# Patient Record
Sex: Male | Born: 1985 | Race: White | Hispanic: No | Marital: Single | Smoking: Current every day smoker
Health system: Southern US, Community
[De-identification: ages and names within clinical notes are randomized; demographics above are authoritative.]

## PROBLEM LIST (undated history)

## (undated) DIAGNOSIS — F419 Anxiety disorder, unspecified: Secondary | ICD-10-CM

## (undated) DIAGNOSIS — F319 Bipolar disorder, unspecified: Secondary | ICD-10-CM

## (undated) DIAGNOSIS — F329 Major depressive disorder, single episode, unspecified: Secondary | ICD-10-CM

---
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
## 2007-09-15 HISTORY — PX: CHOLECYSTECTOMY: SHX55

## 2018-04-16 ENCOUNTER — Emergency Department (HOSPITAL_COMMUNITY)
Admission: EM | Admit: 2018-04-16 | Discharge: 2018-04-16 | Disposition: A | Discharge status: Home / Self Care | Payer: Self-pay | Attending: Emergency Medicine | Admitting: Emergency Medicine

## 2018-04-16 ENCOUNTER — Other Ambulatory Visit: Payer: Self-pay

## 2018-04-16 ENCOUNTER — Encounter (HOSPITAL_COMMUNITY): Payer: Self-pay | Admitting: Emergency Medicine

## 2018-04-16 DIAGNOSIS — Z046 Encounter for general psychiatric examination, requested by authority: Secondary | ICD-10-CM | POA: Insufficient documentation

## 2018-04-16 DIAGNOSIS — F329 Major depressive disorder, single episode, unspecified: Secondary | ICD-10-CM | POA: Insufficient documentation

## 2018-04-16 DIAGNOSIS — R45851 Suicidal ideations: Secondary | ICD-10-CM | POA: Insufficient documentation

## 2018-04-16 DIAGNOSIS — T50902A Poisoning by unspecified drugs, medicaments and biological substances, intentional self-harm, initial encounter: Secondary | ICD-10-CM | POA: Insufficient documentation

## 2018-04-16 DIAGNOSIS — F32A Depression, unspecified: Secondary | ICD-10-CM

## 2018-04-16 DIAGNOSIS — F419 Anxiety disorder, unspecified: Secondary | ICD-10-CM | POA: Insufficient documentation

## 2018-04-16 DIAGNOSIS — F17290 Nicotine dependence, other tobacco product, uncomplicated: Secondary | ICD-10-CM | POA: Insufficient documentation

## 2018-04-16 DIAGNOSIS — Y999 Unspecified external cause status: Secondary | ICD-10-CM | POA: Insufficient documentation

## 2018-04-16 DIAGNOSIS — X838XXA Intentional self-harm by other specified means, initial encounter: Secondary | ICD-10-CM | POA: Insufficient documentation

## 2018-04-16 DIAGNOSIS — T1491XA Suicide attempt, initial encounter: Secondary | ICD-10-CM | POA: Insufficient documentation

## 2018-04-16 DIAGNOSIS — Y9389 Activity, other specified: Secondary | ICD-10-CM | POA: Insufficient documentation

## 2018-04-16 DIAGNOSIS — Y929 Unspecified place or not applicable: Secondary | ICD-10-CM | POA: Insufficient documentation

## 2018-04-16 HISTORY — DX: Bipolar disorder, unspecified: F31.9

## 2018-04-16 HISTORY — DX: Anxiety disorder, unspecified: F41.9

## 2018-04-16 HISTORY — DX: Major depressive disorder, single episode, unspecified: F32.9

## 2018-04-16 LAB — SALICYLATE LEVEL: Salicylate Lvl: <7 mg/dL (ref 2.8–30.0)

## 2018-04-16 LAB — CBC
HCT: 48.4 % (ref 39.0–52.0)
HEMOGLOBIN: 15.3 g/dL (ref 13.0–17.0)
MCH: 24.9 pg — ABNORMAL LOW (ref 26.0–34.0)
MCHC: 31.6 g/dL (ref 30.0–36.0)
MCV: 78.7 fL (ref 78.0–100.0)
Platelets: 305 10*3/uL (ref 150–400)
RBC: 6.15 MIL/uL — ABNORMAL HIGH (ref 4.22–5.81)
RDW: 15.8 % — AB (ref 11.5–15.5)
WBC: 13.7 10*3/uL — ABNORMAL HIGH (ref 4.0–10.5)

## 2018-04-16 LAB — COMPREHENSIVE METABOLIC PANEL
ALBUMIN: 3.6 g/dL (ref 3.5–5.0)
ALT: 34 U/L (ref 0–44)
AST: 25 U/L (ref 15–41)
Alkaline Phosphatase: 74 U/L (ref 38–126)
Anion gap: 15 (ref 5–15)
BUN: 19 mg/dL (ref 6–20)
CHLORIDE: 102 mmol/L (ref 98–111)
CO2: 23 mmol/L (ref 22–32)
CREATININE: 1.33 mg/dL — AB (ref 0.61–1.24)
Calcium: 9.5 mg/dL (ref 8.9–10.3)
GFR calc Af Amer: >60 mL/min (ref >60)
GFR calc non Af Amer: >60 mL/min (ref >60)
Glucose, Bld: 97 mg/dL (ref 70–99)
Potassium: 4 mmol/L (ref 3.5–5.1)
SODIUM: 140 mmol/L (ref 135–145)
Total Bilirubin: 0.4 mg/dL (ref 0.3–1.2)
Total Protein: 7.1 g/dL (ref 6.5–8.1)

## 2018-04-16 LAB — ACETAMINOPHEN LEVEL: Acetaminophen (Tylenol), Serum: <10 ug/mL — ABNORMAL LOW (ref 10–30)

## 2018-04-16 LAB — ETHANOL: Alcohol, Ethyl (B): <10 mg/dL (ref <10)

## 2018-04-16 LAB — RAPID URINE DRUG SCREEN, HOSP PERFORMED
Amphetamines: NOT DETECTED
Barbiturates: NOT DETECTED
Benzodiazepines: NOT DETECTED
COCAINE: NOT DETECTED
OPIATES: NOT DETECTED
Tetrahydrocannabinol: POSITIVE — AB

## 2018-04-16 NOTE — ED Provider Notes (Signed)
[facility] Provider Note   CSN: [id number] Arrival date & time: 04/16/18  0030     History   Chief Complaint Chief Complaint  Patient presents with  . Drug Overdose  . Suicide Attempt    HPI Bradley Le is a 32 y.o. male.  Patient is a 32 year old male with past medical history of bipolar disorder, anxiety, and morbid obesity.  He presents today for evaluation of an overdose.  He states earlier this evening he had what he describes as a "severe anxiety attack".  He states he then took 5-25 mg Vistaril tablets along with 8-10 mg buspirone tablets.  He tells me he did this with the intent of harming himself.  He denies any drug or alcohol use.  He denies any other substance ingestion.  The history is provided by the patient.  Drug Overdose  This is a new problem. The current episode started 1 to 2 hours ago. The problem occurs constantly. The problem has not changed since onset.Pertinent negatives include no chest pain and no abdominal pain. Nothing aggravates the symptoms. Nothing relieves the symptoms. He has tried nothing for the symptoms.    Past Medical History:  Diagnosis Date  . Anxiety disorder 2004  . Bipolar 1 disorder (HCC) 2004  . Depression 2004    There are no active problems to display for this patient.         Home Medications    Prior to Admission medications   Not on File    Family History No family history on file.  Social History Social History   Tobacco Use  . Smoking status: Current Every Day Smoker    Packs/day: 0.50    Years: 13.00    Pack years: 6.50    Types: Pipe  . Smokeless tobacco: Never Used  Substance Use Topics  . Alcohol use: Yes    Comment: occasional drink  . Drug use: Never     Allergies   Sulfa antibiotics   Review of Systems Review of Systems  Cardiovascular: Negative for chest pain.  Gastrointestinal: Negative for abdominal pain.  All other systems reviewed and  are negative.    Physical Exam Updated Vital Signs BP (!) 154/107 (BP Location: Right Arm)   Pulse (!) 106   Temp 98 F (36.7 C) (Oral)   Resp 18   Ht 5\' 11"  (1.803 m)   Wt (!) 188.7 kg (416 lb)   SpO2 95%   BMI 58.02 kg/m   Physical Exam  Constitutional: He is oriented to person, place, and time. He appears well-developed and well-nourished. No distress.  HENT:  Head: Normocephalic and atraumatic.  Mouth/Throat: Oropharynx is clear and moist.  Eyes: Pupils are equal, round, and reactive to light.  Neck: Normal range of motion. Neck supple.  Cardiovascular: Normal rate and regular rhythm. Exam reveals no friction rub.  No murmur heard. Pulmonary/Chest: Effort normal and breath sounds normal. No respiratory distress. He has no wheezes. He has no rales.  Abdominal: Soft. Bowel sounds are normal. He exhibits no distension. There is no tenderness.  Musculoskeletal: Normal range of motion. He exhibits no edema.  Neurological: He is alert and oriented to person, place, and time. Coordination normal.  Skin: Skin is warm and dry. He is not diaphoretic.  Nursing note and vitals reviewed.    ED Treatments / Results  Labs (all labs ordered are listed, but only abnormal results are displayed) Labs Reviewed  COMPREHENSIVE METABOLIC PANEL  ETHANOL  SALICYLATE LEVEL  ACETAMINOPHEN LEVEL  CBC  RAPID URINE DRUG SCREEN, HOSP PERFORMED    ED ECG REPORT   Date: 04/16/2018  Rate: 110  Rhythm: sinus tachycardia  QRS Axis: normal  Intervals: normal  ST/T Wave abnormalities: normal  Conduction Disutrbances:none  Narrative Interpretation:   Old EKG Reviewed: none available  I have personally reviewed the EKG tracing and agree with the computerized printout as noted.   Radiology No results found.  Procedures Procedures (including critical care time)  Medications Ordered in ED Medications - No data to display   Initial Impression / Assessment and Plan / ED Course  I have  reviewed the triage vital signs and the nursing notes.  Pertinent labs & imaging results that were available during my care of the patient were reviewed by me and considered in my medical decision making (see chart for details).  Patient seen by TTS following an overdose of Benadryl and Seroquel.  He has been observed for multiple hours in the ER and appears medically cleared.  TTS is recommending inpatient.  Final Clinical Impressions(s) / ED Diagnoses   Final diagnoses:  None    ED Discharge Orders    None       Geoffery Lyons, MD 04/16/18 [id number]

## 2018-04-16 NOTE — ED Notes (Signed)
ALL belongings - 1 labeled belongings bag - NO Valuables envelope noted - returned to pt - Pt signed verifying all items present. Pt called someone for ride from ED.

## 2018-04-16 NOTE — ED Notes (Signed)
Staffing office called for sitter. 

## 2018-04-16 NOTE — [facility] Suicide Risk Assessment (Cosign Needed)
Suicide Risk Assessment  Discharge Assessment   [facility]   Principal Problem: Depression Discharge Diagnoses: There are no active problems to display for this patient.   Total Time spent with patient: 30 minutes  Musculoskeletal: Strength & Muscle Tone: within normal limits Gait & Station: not tested Patient leans: N/A Pt was seen and chart reviewed with treatment team and Dr Jannifer FranklinAkintayo. Pt stated he was depressed and so he came to the hospital, he was feeling suicidal but today stated that is no longer the case and he wants to be discharged home. He lives with his partners grandmother and is able to contract for safety upon discharge. According to chart review he stated on admission that he took 5 25 mg vistaril and 8 Buspar 10 mg because he was having a bad day and wanted to hurt himself. Pt stated he wasn't wanting to die, just numb the pain. Pt denies drug and alcohol use, BAL negative, UDS positive for THC. Lab work is WNL, no other diagnostics ordered during this admission. [facility] and has an appointment on Monday 04-18-2018. Pt is stable and psychiatrically clear for discharge.   Psychiatric Specialty Exam:   Blood pressure (!) 148/87, pulse 89, temperature 98.4 F (36.9 C), temperature source Oral, resp. rate 18, height 5\' 11"  (1.803 m), weight (!) 416 lb (188.7 kg), SpO2 95 %.Body mass index is 58.02 kg/m.  General Appearance: Casual  Eye Contact::  Good  Speech:  Clear and Coherent and Normal Rate409  Volume:  Normal  Mood:  Depressed  Affect:  Congruent, Depressed and Flat  Thought Process:  Coherent, Goal Directed, Linear and Descriptions of Associations: Intact  Orientation:  Full (Time, Place, and Person)  Thought Content:  Logical  Suicidal Thoughts:  No  Homicidal Thoughts:  No  Memory:  Immediate;   Good Recent;   Good Remote;   Fair  Judgement:  Fair  Insight:  Fair  Psychomotor  Activity:  Normal  Concentration:  Good  Recall:  Good  Fund of Knowledge:Good  Language: Good  Akathisia:  No  Handed:  Right  AIMS (if indicated):     Assets:  Communication Skills Desire for Improvement Financial Resources/Insurance Housing Social Support  Sleep:     Cognition: WNL  ADL's:  Intact   Mental Status Per Nursing Assessment::   On Admission:   Depressed   Demographic Factors:  Male, Caucasian, Low socioeconomic status and Unemployed  Loss Factors: Financial problems/change in socioeconomic status  Historical Factors: Impulsivity  Risk Reduction Factors:   Sense of responsibility to family, Living with another person, especially a relative and Positive social support  Continued Clinical Symptoms:  Depression:   Impulsivity  Cognitive Features That Contribute To Risk:  Closed-mindedness    Suicide Risk:  Minimal: No identifiable suicidal ideation.  Patients presenting with no risk factors but with morbid ruminations; may be classified as minimal risk based on the severity of the depressive symptoms    Plan Of Care/Follow-up recommendations:  Activity:  as tolerated Diet:  Heart healthy  Laveda AbbeLaurie Britton Laritza Vokes, NP 04/16/2018, 10:18 AM
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
## 2018-04-16 NOTE — ED Triage Notes (Signed)
  Patient BIB [facility] after intentional OD of vistaril (5 25mg ) and busparone (8 10mg ).  Patient was trying to hurt himself because he was having a "bad day".  Patient was immediately remorseful and called EMS.  Patient states this was his first time ever trying to hurt himself.  Patient is A&O x 4.  VSS.

## 2018-04-16 NOTE — Progress Notes (Signed)
TTS consulted with Nanine MeansJamison Lord, DNP who recommends inpt treatment. EDP Delo, Riley Lamouglas, MD has been advised.  TTS attempted to inform RN staff of the disposition but no answer when called.  Princess BruinsAquicha Ivann Trimarco, MSW, LCSW Therapeutic Triage Specialist  [phone]

## 2018-04-16 NOTE — ED Notes (Signed)
Pt arrived to [facility]8 - ambulatory - noted to be wearing eyeglasses and wearing burgundy scrubs - large size gown given for comfort. Pt voiced understanding of Medical Clearance Pt Policy. Sitter w/pt. Pt states he ate breakfast.

## 2018-04-16 NOTE — Progress Notes (Signed)
Patient meets criteria for inpatient treatment. CSW faxed referrals to the following facilities for review.  [facility], [facility], [facility], [facility], [address] [place], [facility], [facility], [facility], [address], [facility], [address], [facility], [facility], [facility], [facility], [facility], and [facility].   TTS will continue to seek bed placement.     Moss McKy-sha Annasofia Pohl, MSW, LCSW, LCAS 04/16/2018 8:05 AM

## 2018-04-16 NOTE — ED Notes (Signed)
Reg bfast tray ordered/No [place]

## 2018-04-16 NOTE — ED Notes (Signed)
  Patient placed in purple scrubs and belongings taken out of room.  Patient has been wanded by security.

## 2019-09-01 ENCOUNTER — Emergency Department (HOSPITAL_COMMUNITY): Payer: Medicaid Other

## 2019-09-01 ENCOUNTER — Encounter (HOSPITAL_COMMUNITY): Payer: Self-pay | Admitting: Emergency Medicine

## 2019-09-01 ENCOUNTER — Emergency Department (HOSPITAL_COMMUNITY)
Admission: EM | Admit: 2019-09-01 | Discharge: 2019-09-01 | Disposition: A | Discharge status: Home / Self Care | Payer: Medicaid Other | Attending: Emergency Medicine | Admitting: Emergency Medicine

## 2019-09-01 ENCOUNTER — Other Ambulatory Visit: Payer: Self-pay

## 2019-09-01 DIAGNOSIS — M25551 Pain in right hip: Secondary | ICD-10-CM | POA: Insufficient documentation

## 2019-09-01 DIAGNOSIS — R202 Paresthesia of skin: Secondary | ICD-10-CM | POA: Diagnosis not present

## 2019-09-01 DIAGNOSIS — M25512 Pain in left shoulder: Secondary | ICD-10-CM | POA: Diagnosis present

## 2019-09-01 DIAGNOSIS — F17210 Nicotine dependence, cigarettes, uncomplicated: Secondary | ICD-10-CM | POA: Diagnosis not present

## 2019-09-01 DIAGNOSIS — Z79899 Other long term (current) drug therapy: Secondary | ICD-10-CM | POA: Insufficient documentation

## 2019-09-01 DIAGNOSIS — T07XXXA Unspecified multiple injuries, initial encounter: Secondary | ICD-10-CM

## 2019-09-01 DIAGNOSIS — W19XXXA Unspecified fall, initial encounter: Secondary | ICD-10-CM

## 2019-09-01 MED ORDER — IBUPROFEN 600 MG PO TABS: 600.0000 mg | ORAL_TABLET | Freq: Four times a day (QID) | ORAL | 0 refills | Status: AC | PRN

## 2019-09-01 MED ORDER — IBUPROFEN 800 MG PO TABS
800.0000 mg | ORAL_TABLET | Freq: Once | ORAL | Status: AC
  Administered 2019-09-01: 800 mg via ORAL
  Filled 2019-09-01: qty 1

## 2019-09-01 MED ORDER — HYDROCODONE-ACETAMINOPHEN 5-325 MG PO TABS
1.0000 | ORAL_TABLET | Freq: Once | ORAL | Status: AC
  Administered 2019-09-01: 1 via ORAL
  Filled 2019-09-01: qty 1

## 2019-09-01 NOTE — Discharge Instructions (Signed)
Your x-rays are negative.  Take the anti-inflammatories as prescribed and follow-up with your doctor.  Return to the ED with new or worsening symptoms.

## 2019-09-01 NOTE — ED Notes (Signed)
ED Provider at bedside. 

## 2019-09-01 NOTE — ED Notes (Signed)
Patient transported to X-ray 

## 2019-09-01 NOTE — ED Provider Notes (Signed)
[facility]-EMERGENCY DEPT Provider Note   CSN: [id number] Arrival date & time: 09/01/19  2021     History Chief Complaint  Patient presents with  . Fall    Bradley DialDavid Salce is a 33 y.o. male.  Patient with history of morbid obesity, bipolar disorder presenting with left shoulder pain and right hip pain after a fall.  States he slipped in the mud and fell directly onto his low back.  Not hit his head or lose consciousness.  Since the fall has been having left shoulder pain and right hip pain.  Did not take anything for it.  He was able to ambulate for EMS.  Denies any neck pain.  Denies any abdominal pain, chest pain or shortness of breath.  Denies taking any blood thinners.  Had some tingling in his left hand initially which is since resolved.  Complains of pain in the right posterior hip and left anterior shoulder and also soreness in his low back.  The history is provided by the patient.  Fall Pertinent negatives include no chest pain, no abdominal pain, no headaches and no shortness of breath.       Past Medical History:  Diagnosis Date  . Anxiety disorder 2004  . Bipolar 1 disorder (HCC) 2004  . Depression 2004    Patient Active Problem List   Diagnosis Date Noted  . Depression 04/16/2018    Past Surgical History:  Procedure Laterality Date  . CHOLECYSTECTOMY  2009       No family history on file.  Social History   Tobacco Use  . Smoking status: Current Every Day Smoker    Packs/day: 0.50    Years: 13.00    Pack years: 6.50    Types: Pipe  . Smokeless tobacco: Never Used  Substance Use Topics  . Alcohol use: Yes    Comment: occasional drink  . Drug use: Never    Home Medications Prior to Admission medications   Medication Sig Start Date End Date Taking? Authorizing Provider  acetaminophen (TYLENOL) 500 MG tablet Take 1,000 mg by mouth every 6 (six) hours as needed (shoulder pain).    [provider]  ARIPiprazole  (ABILIFY PO) Take 1 tablet by mouth at bedtime.    [provider]  BUSPIRONE HCL PO Take 1.5 tablets by mouth 3 (three) times daily.    [provider]    Allergies    Sulfa antibiotics  Review of Systems   Review of Systems  Constitutional: Negative for activity change, appetite change and fever.  HENT: Negative for congestion and rhinorrhea.   Eyes: Negative for visual disturbance.  Respiratory: Negative for cough and shortness of breath.   Cardiovascular: Negative for chest pain.  Gastrointestinal: Negative for abdominal pain, nausea and vomiting.  Genitourinary: Negative for dysuria and hematuria.  Musculoskeletal: Positive for arthralgias, back pain and myalgias.  Skin: Negative for rash.  Neurological: Negative for dizziness and headaches.   all other systems are negative except as noted in the HPI and PMH.   Physical Exam Updated Vital Signs BP (!) 133/97   Pulse 93   Temp 98.2 F (36.8 C)   Resp (!) 21   SpO2 98%   Physical Exam Vitals and nursing note reviewed.  Constitutional:      General: He is not in acute distress.    Appearance: He is well-developed. He is obese.  HENT:     Head: Normocephalic and atraumatic.     Mouth/Throat:  Pharynx: No oropharyngeal exudate.  Eyes:     Conjunctiva/sclera: Conjunctivae normal.     Pupils: Pupils are equal, round, and reactive to light.  Neck:     Comments: No midline C-spine tenderness Cardiovascular:     Rate and Rhythm: Normal rate and regular rhythm.     Heart sounds: Normal heart sounds. No murmur.  Pulmonary:     Effort: Pulmonary effort is normal. No respiratory distress.     Breath sounds: Normal breath sounds.  Abdominal:     Palpations: Abdomen is soft.     Tenderness: There is no abdominal tenderness. There is no guarding or rebound.  Musculoskeletal:        General: Tenderness present. Normal range of motion.     Cervical back: Normal range of motion and neck supple.      Comments: Exam limited by body habitus.  There is paraspinal lumbar tenderness without step-off.  There is right posterior hip tenderness without shortening or external rotation.  Patient is able to stand and bear weight.  Left anterior shoulder tenderness without deformity.  Intact radial pulse.  Intact DP and PT pulses bilaterally. Pain with ROM R hip.  Skin:    General: Skin is warm.     Capillary Refill: Capillary refill takes less than 2 seconds.  Neurological:     General: No focal deficit present.     Mental Status: He is alert and oriented to person, place, and time. Mental status is at baseline.     Cranial Nerves: No cranial nerve deficit.     Motor: No abnormal muscle tone.     Coordination: Coordination normal.     Comments: No ataxia on finger to nose bilaterally. No pronator drift. 5/5 strength throughout. CN 2-12 intact.Equal grip strength. Sensation intact.   Psychiatric:        Behavior: Behavior normal.     ED Results / Procedures / Treatments   Labs (all labs ordered are listed, but only abnormal results are displayed) Labs Reviewed - No data to display  EKG None  Radiology DG Lumbar Spine Complete  Result Date: 09/01/2019 CLINICAL DATA:  Slip and fall.  Right hip pain.  Left shoulder pain. EXAM: LUMBAR SPINE - COMPLETE 4+ VIEW COMPARISON:  None. FINDINGS: The alignment is maintained. Vertebral body heights are normal. Trace retrolisthesis of L5 on S1. The posterior elements are intact. Mild endplate spurring of the upper lumbar spine with minimal L1-L2 disc space narrowing. No fracture. Sacroiliac joints are symmetric and normal. IMPRESSION: No fracture of the lumbar spine.  Minor spondylosis. Electronically Signed   By: Narda Rutherford M.D.   On: 09/01/2019 23:10   CT Hip Right Wo Contrast  Result Date: 09/01/2019 CLINICAL DATA:  Slip and fall with right hip pain. EXAM: CT OF THE RIGHT HIP WITHOUT CONTRAST TECHNIQUE: Multidetector CT imaging of the right  hip was performed according to the standard protocol. Multiplanar CT image reconstructions were also generated. COMPARISON:  Radiograph earlier this day. FINDINGS: Bones/Joint/Cartilage No fracture of the right hip for hemipelvis. Femoral head is well seated. Superolateral acetabular osteophytes. Small acetabular bone island. No evidence of a vascular necrosis. The pubic rami are intact. No joint effusion. Mild vacuum phenomena in the sacroiliac joint. Ligaments Suboptimally assessed by CT. Muscles and Tendons No intramuscular hematoma. Soft tissues No soft tissue hematoma.  No intrapelvic free fluid. IMPRESSION: 1. No fracture of the right hip. 2. Minor degenerative acetabular spurring. Electronically Signed   By: Ivette Loyal.D.  On: 09/01/2019 23:12   DG Shoulder Left  Result Date: 09/01/2019 CLINICAL DATA:  Slip and fall.  Left shoulder pain. EXAM: LEFT SHOULDER - 2+ VIEW COMPARISON:  None. FINDINGS: There is no evidence of fracture or dislocation. There is no evidence of arthropathy or other focal bone abnormality. Soft tissues are unremarkable. IMPRESSION: Negative radiographs of the left shoulder. Electronically Signed   By: Keith Rake M.D.   On: 09/01/2019 23:08   DG HIP INFANT UNILAT WITH PELVIS 2-3 VIEWS RIGHT  Result Date: 09/01/2019 CLINICAL DATA:  Slip and fall with right hip pain. EXAM: DG HIP (WITH OR WITHOUT PELVIS) INFANT 2-3V RIGHT COMPARISON:  None. FINDINGS: The cortical margins of the bony pelvis and right hip are intact. No fracture. Pubic symphysis and sacroiliac joints are congruent. Bilateral acetabular osteophytes. Both femoral heads are well-seated in the respective acetabula. IMPRESSION: No evidence of pelvic or hip fracture. Electronically Signed   By: Keith Rake M.D.   On: 09/01/2019 23:09    Procedures Procedures (including critical care time)  Medications Ordered in ED Medications  HYDROcodone-acetaminophen (NORCO/VICODIN) 5-325 MG per tablet 1  tablet (has no administration in time range)  ibuprofen (ADVIL) tablet 800 mg (has no administration in time range)    ED Course  I have reviewed the triage vital signs and the nursing notes.  Pertinent labs & imaging results that were available during my care of the patient were reviewed by me and considered in my medical decision making (see chart for details).    MDM Rules/Calculators/A&P                      Morbid obese patient with mechanical fall.  Denies head injury.  Complains of right hip and left shoulder pain.  Neurovascularly intact.  Xrays negative.  Given his large size, CT was obtained to further evaluate R hip and was negative for fracture.  Patient able to bear weight and ambulate.  Will treat supportively for contusions with NSAIDs.   PCP followup. Return precautions discussed.  Final Clinical Impression(s) / ED Diagnoses Final diagnoses:  Fall, initial encounter  Multiple contusions    Rx / DC Orders ED Discharge Orders    None       Normal Recinos, Annie Main, MD 09/02/19 0111

## 2019-09-01 NOTE — ED Triage Notes (Signed)
Per EMS, patient from home, reports slip and fall onto right hip. C/o left shoulder and right hip pain. Ambulatory. Denies head injury and LOC.

## 2020-04-04 ENCOUNTER — Emergency Department (HOSPITAL_COMMUNITY)
Admission: EM | Admit: 2020-04-04 | Discharge: 2020-04-05 | Discharge status: Against Medical Advice | Payer: Medicaid Other | Attending: Emergency Medicine | Admitting: Emergency Medicine

## 2020-04-04 DIAGNOSIS — R5383 Other fatigue: Secondary | ICD-10-CM | POA: Insufficient documentation

## 2020-04-04 DIAGNOSIS — K122 Cellulitis and abscess of mouth: Secondary | ICD-10-CM | POA: Insufficient documentation

## 2020-04-04 DIAGNOSIS — Z5321 Procedure and treatment not carried out due to patient leaving prior to being seen by health care provider: Secondary | ICD-10-CM | POA: Insufficient documentation

## 2020-04-04 DIAGNOSIS — R509 Fever, unspecified: Secondary | ICD-10-CM | POA: Diagnosis not present

## 2020-04-04 DIAGNOSIS — R112 Nausea with vomiting, unspecified: Secondary | ICD-10-CM | POA: Diagnosis not present

## 2020-04-05 ENCOUNTER — Encounter (HOSPITAL_COMMUNITY): Payer: Self-pay | Admitting: Emergency Medicine

## 2020-04-05 ENCOUNTER — Other Ambulatory Visit: Payer: Self-pay

## 2020-04-05 LAB — URINALYSIS, ROUTINE W REFLEX MICROSCOPIC
Bilirubin Urine: NEGATIVE
Glucose, UA: NEGATIVE mg/dL
Hgb urine dipstick: NEGATIVE
Ketones, ur: NEGATIVE mg/dL
Leukocytes,Ua: NEGATIVE
Nitrite: NEGATIVE
Protein, ur: NEGATIVE mg/dL
Specific Gravity, Urine: 1.017 (ref 1.005–1.030)
pH: 5 (ref 5.0–8.0)

## 2020-04-05 LAB — COMPREHENSIVE METABOLIC PANEL
ALT: 25 U/L (ref 0–44)
AST: 20 U/L (ref 15–41)
Albumin: 3.3 g/dL — ABNORMAL LOW (ref 3.5–5.0)
Alkaline Phosphatase: 61 U/L (ref 38–126)
Anion gap: 8 (ref 5–15)
BUN: 15 mg/dL (ref 6–20)
CO2: 24 mmol/L (ref 22–32)
Calcium: 8.9 mg/dL (ref 8.9–10.3)
Chloride: 103 mmol/L (ref 98–111)
Creatinine, Ser: 1.13 mg/dL (ref 0.61–1.24)
GFR calc Af Amer: >60 mL/min (ref >60)
GFR calc non Af Amer: >60 mL/min (ref >60)
Glucose, Bld: 125 mg/dL — ABNORMAL HIGH (ref 70–99)
Potassium: 3.8 mmol/L (ref 3.5–5.1)
Sodium: 135 mmol/L (ref 135–145)
Total Bilirubin: 0.4 mg/dL (ref 0.3–1.2)
Total Protein: 6.5 g/dL (ref 6.5–8.1)

## 2020-04-05 LAB — CBC WITH DIFFERENTIAL/PLATELET
Abs Immature Granulocytes: 0.05 10*3/uL (ref 0.00–0.07)
Basophils Absolute: 0.1 10*3/uL (ref 0.0–0.1)
Basophils Relative: 1 %
Eosinophils Absolute: 0.4 10*3/uL (ref 0.0–0.5)
Eosinophils Relative: 4 %
HCT: 46.2 % (ref 39.0–52.0)
Hemoglobin: 14.8 g/dL (ref 13.0–17.0)
Immature Granulocytes: 0 %
Lymphocytes Relative: 23 %
Lymphs Abs: 2.6 10*3/uL (ref 0.7–4.0)
MCH: 25.6 pg — ABNORMAL LOW (ref 26.0–34.0)
MCHC: 32 g/dL (ref 30.0–36.0)
MCV: 80.1 fL (ref 80.0–100.0)
Monocytes Absolute: 0.4 10*3/uL (ref 0.1–1.0)
Monocytes Relative: 4 %
Neutro Abs: 7.7 10*3/uL (ref 1.7–7.7)
Neutrophils Relative %: 68 %
Platelets: 309 10*3/uL (ref 150–400)
RBC: 5.77 MIL/uL (ref 4.22–5.81)
RDW: 16.1 % — ABNORMAL HIGH (ref 11.5–15.5)
WBC: 11.3 10*3/uL — ABNORMAL HIGH (ref 4.0–10.5)
nRBC: 0 % (ref 0.0–0.2)

## 2020-04-05 LAB — LACTIC ACID, PLASMA: Lactic Acid, Venous: 1 mmol/L (ref 0.5–1.9)

## 2020-04-05 MED ORDER — SODIUM CHLORIDE 0.9% FLUSH: 3.0000 mL | Freq: Once | INTRAVENOUS | Status: DC

## 2020-04-05 NOTE — ED Triage Notes (Signed)
Pt reports "abscess in the roof of my mouth." Pt reports mild fever for 4 days, n/v,  fatigue and "disoriented".  Hx of hospitalizations for infections in the past.

## 2020-04-05 NOTE — ED Notes (Signed)
Called for VS recheck x3, no response. 

## 2020-05-23 IMAGING — CR DG LUMBAR SPINE COMPLETE 4+V
6 series · 6 of 6 positions shown · non-contrast
Comparison: None.

CLINICAL DATA: Slip and fall.  Right hip pain.  Left shoulder pain.

EXAM:
LUMBAR SPINE - COMPLETE 4+ VIEW

[t lumbar spine ap]
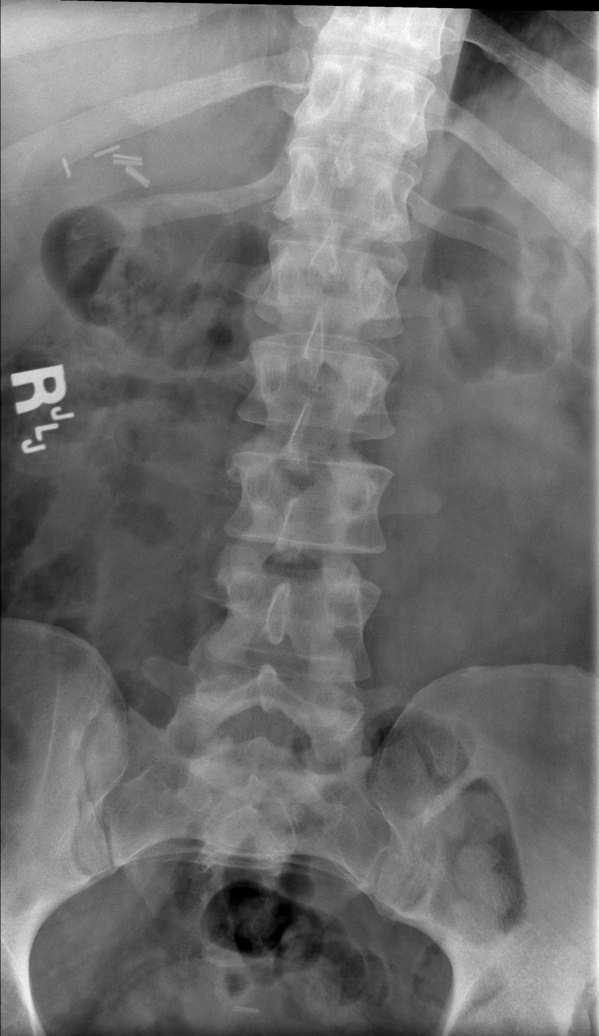

[t lumbar spine obl (1 of 3)]
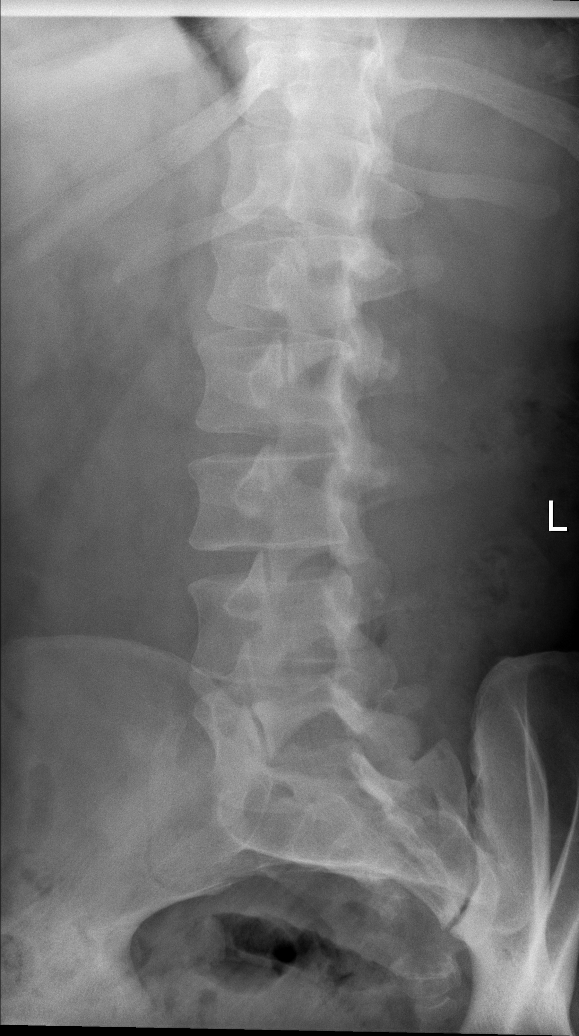

[t lumbar spine obl (2 of 3)]
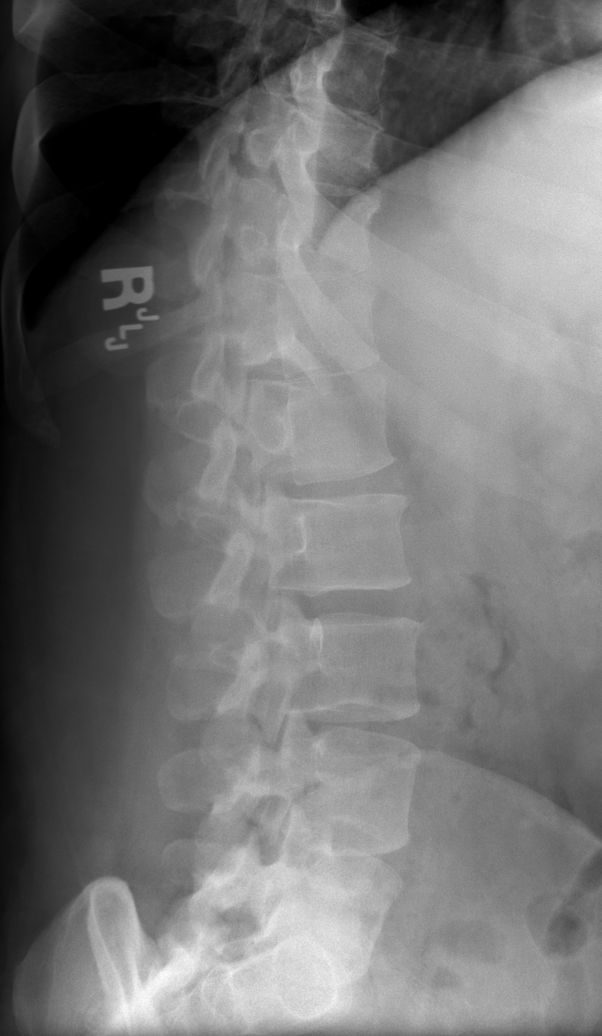

[t lumbar spine obl (3 of 3)]
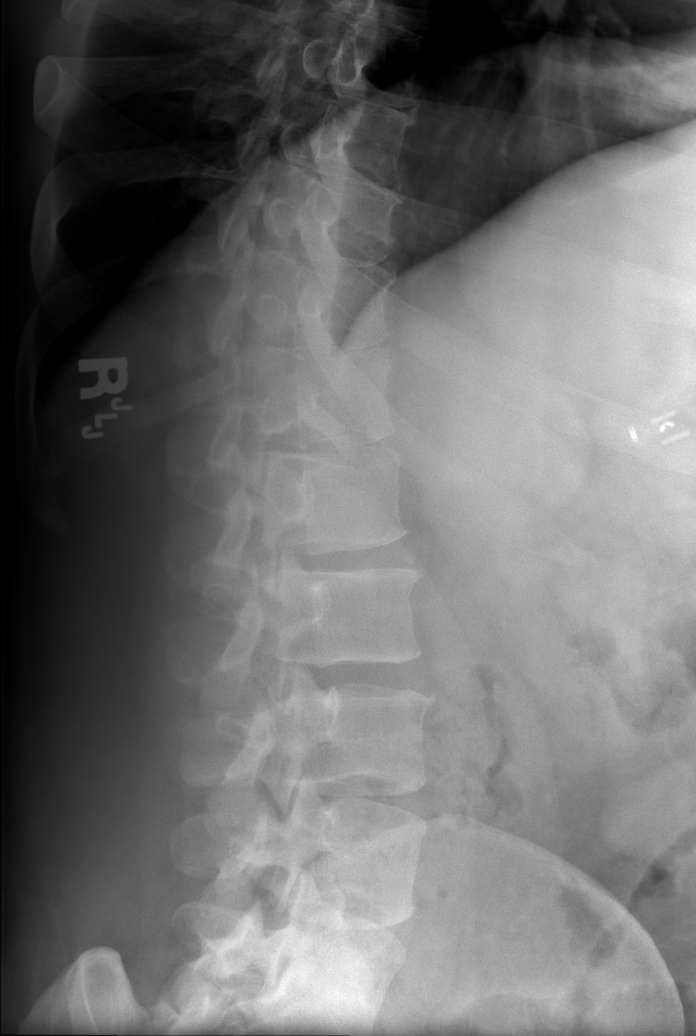

[t lumbar spine lat]
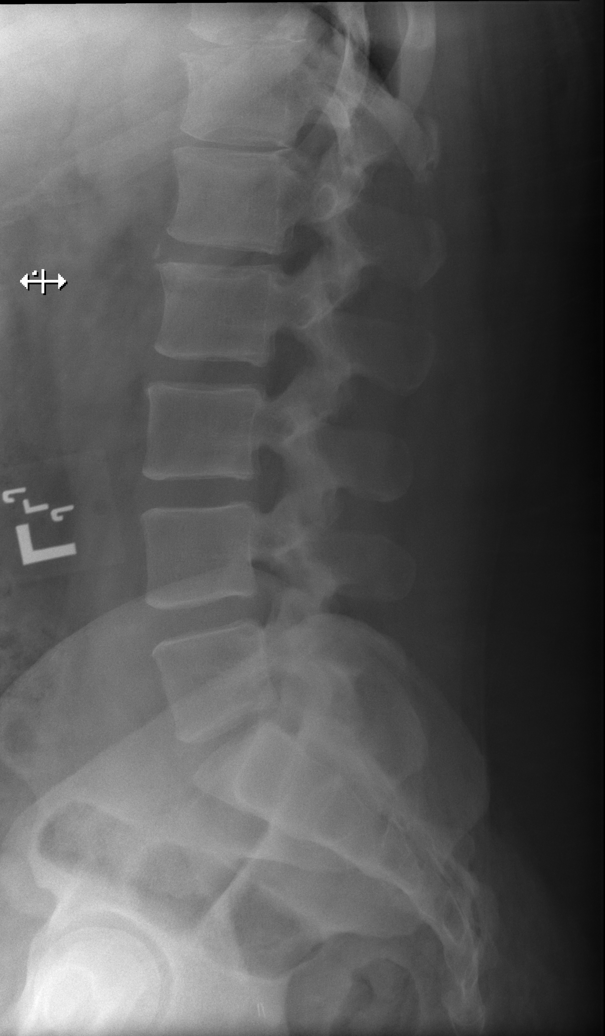

[t lumbar l-5 s-1 spot]
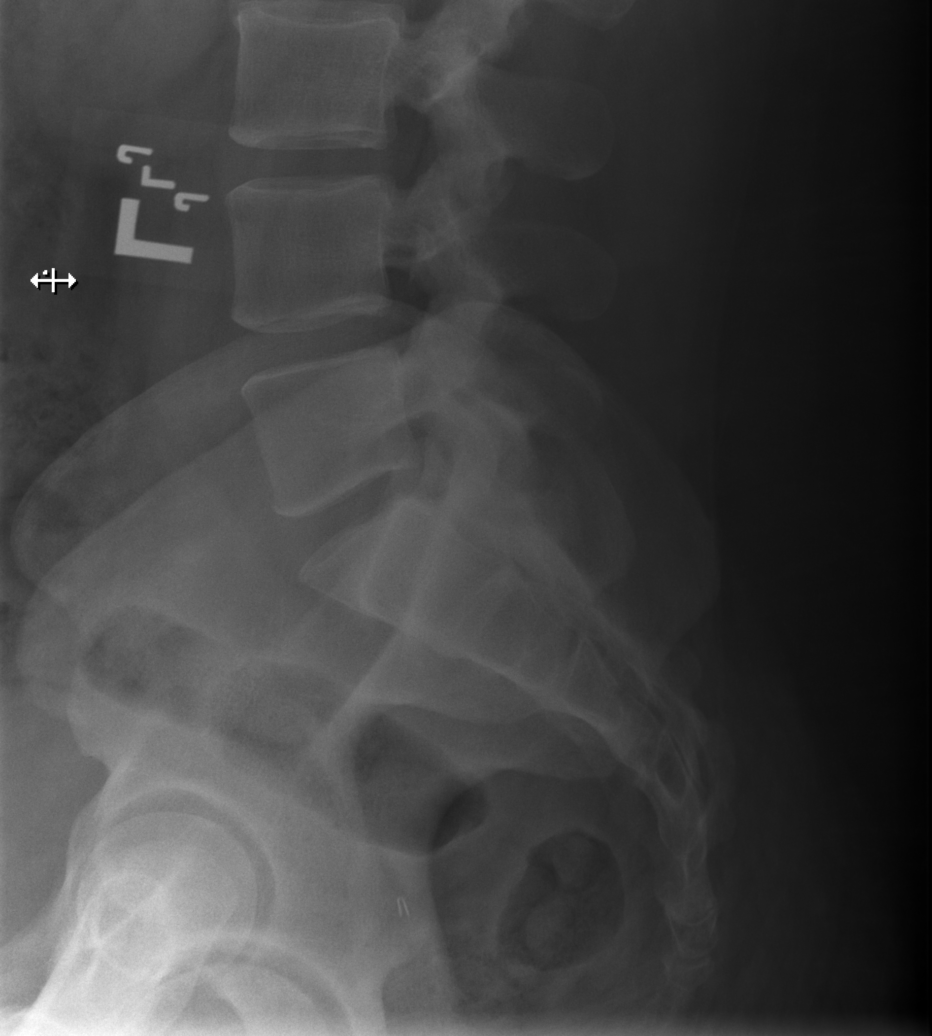

[6 of 6 positions shown; findings below may reference images not displayed]

FINDINGS: The alignment is maintained. Vertebral body heights are normal.
Trace retrolisthesis of L5 on S1. The posterior elements are intact.
Mild endplate spurring of the upper lumbar spine with minimal L1-L2
disc space narrowing. No fracture. Sacroiliac joints are symmetric
and normal.
IMPRESSION: No fracture of the lumbar spine.  Minor spondylosis.

## 2020-05-23 IMAGING — CT CT HIP*R* W/O CM
2 of 3 series · 16 of 46 positions shown, 18 images · non-contrast
Comparison: Radiograph earlier this day.

CLINICAL DATA: Slip and fall with right hip pain.

EXAM:
CT OF THE RIGHT HIP WITHOUT CONTRAST
TECHNIQUE: Multidetector CT imaging of the right hip was performed according to
the standard protocol. Multiplanar CT image reconstructions were
also generated.

[Series 3: axial st · axial · 0.47mm/px · z∈[-142,+20]mm · 13 of 93 slices shown, 15 images]
[im 6/93  soft-tissue]
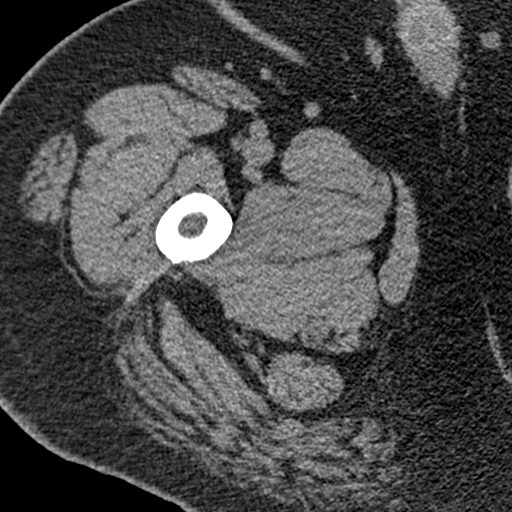
[im 6/93  bone]
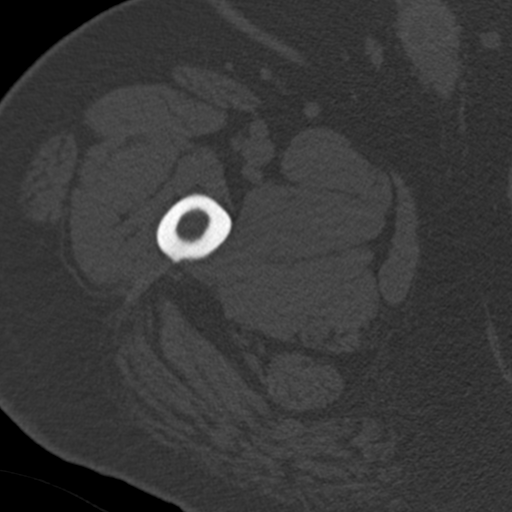
[im 12/93  soft-tissue]
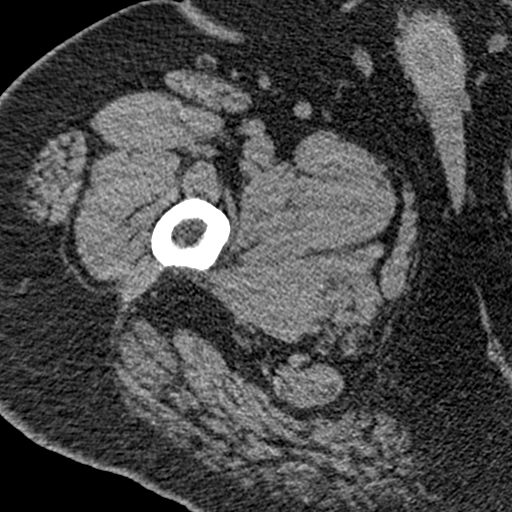
[im 18/93  soft-tissue]
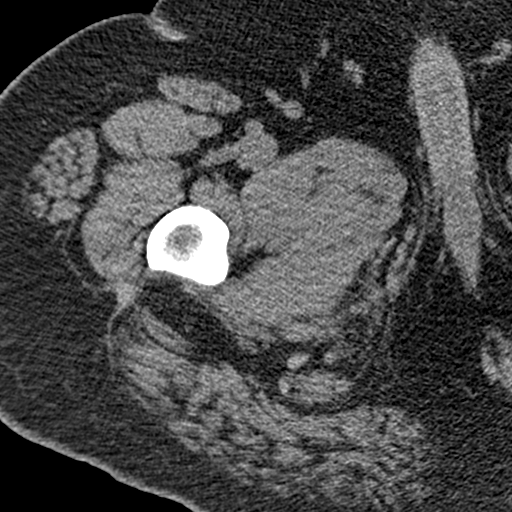
[im 27/93  soft-tissue]
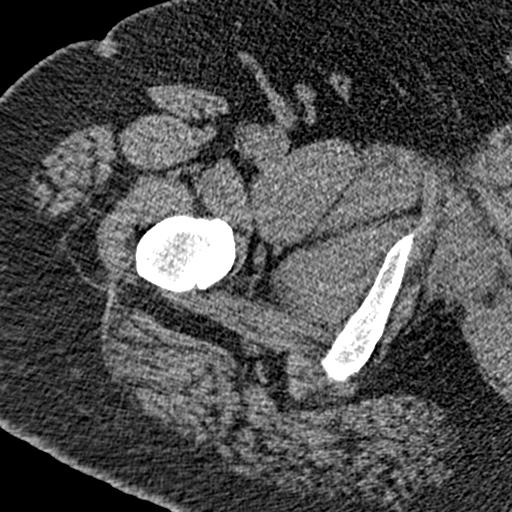
[im 33/93  soft-tissue]
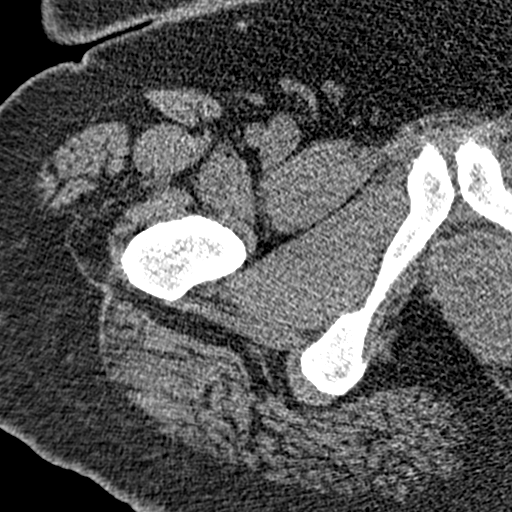
[im 39/93  soft-tissue]
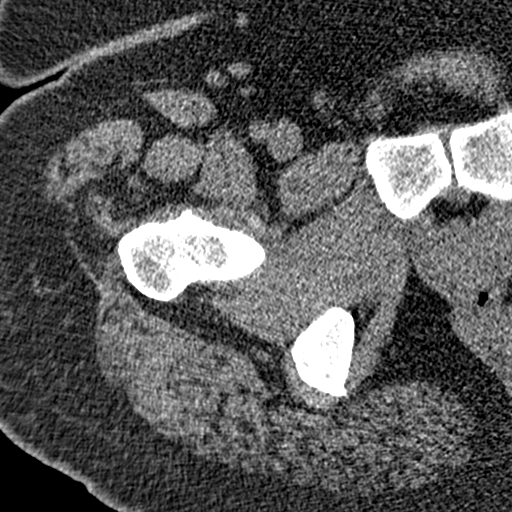
[im 48/93  soft-tissue]
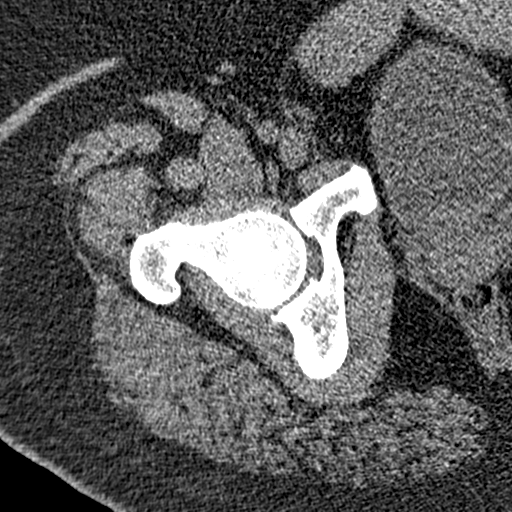
[im 54/93  soft-tissue]
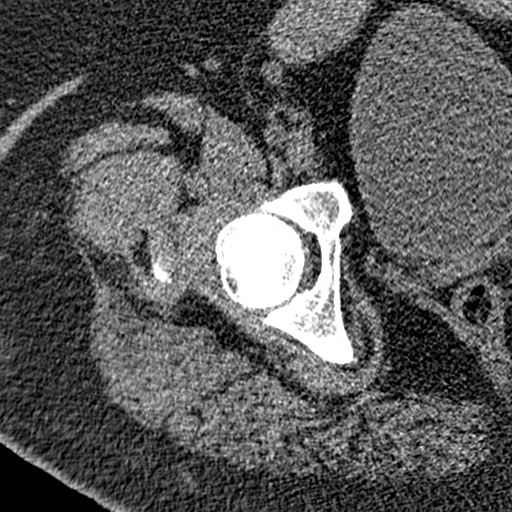
[im 60/93  soft-tissue]
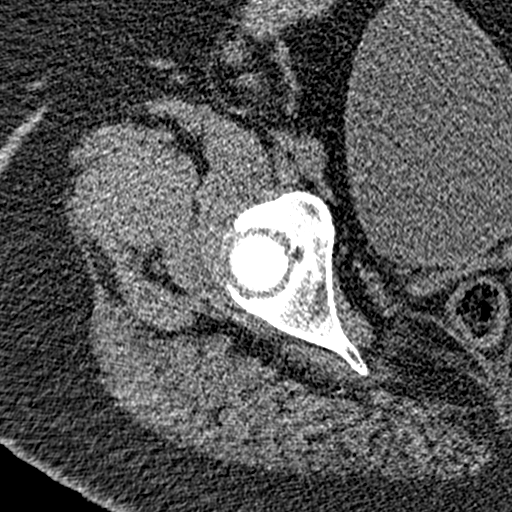
[im 60/93  bone]
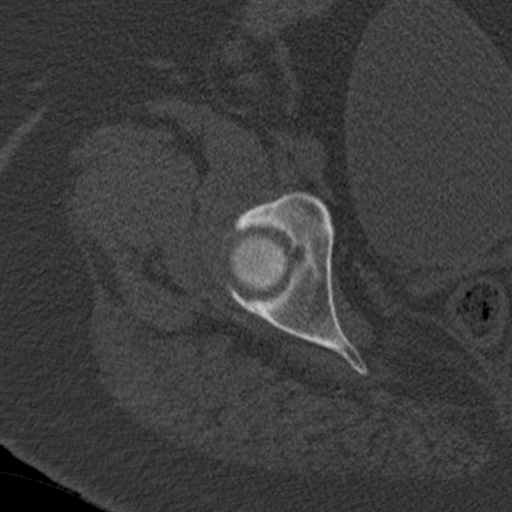
[im 66/93  soft-tissue]
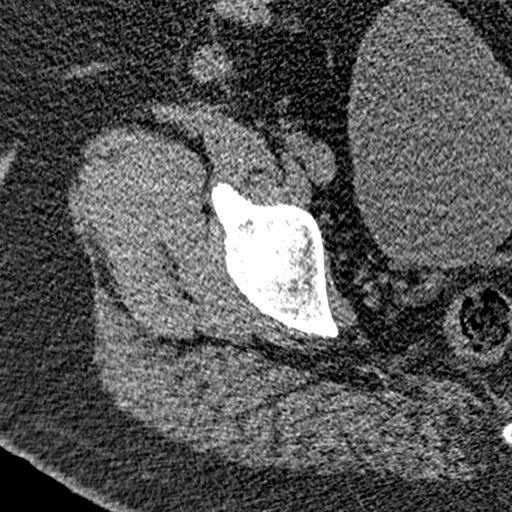
[im 75/93  soft-tissue]
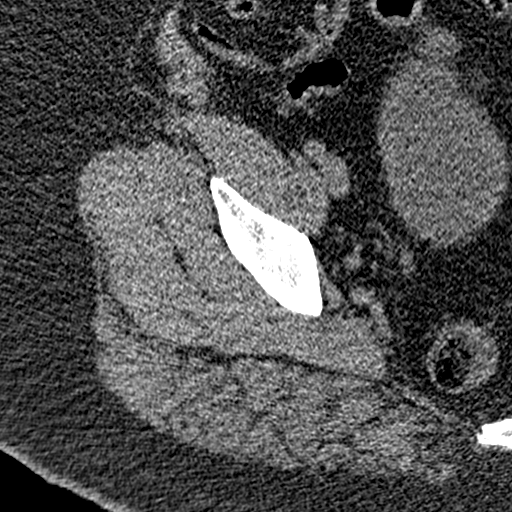
[im 81/93  soft-tissue]
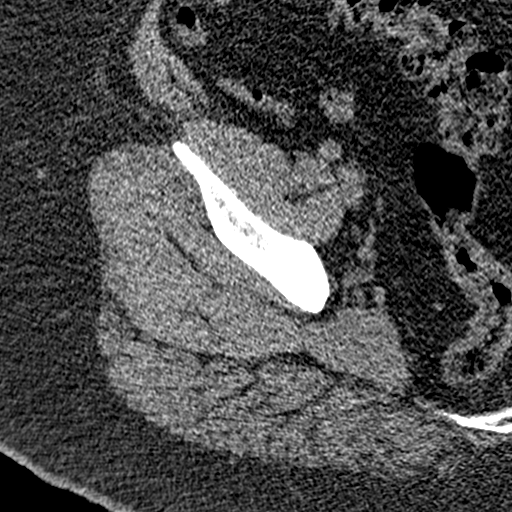
[im 87/93  soft-tissue]
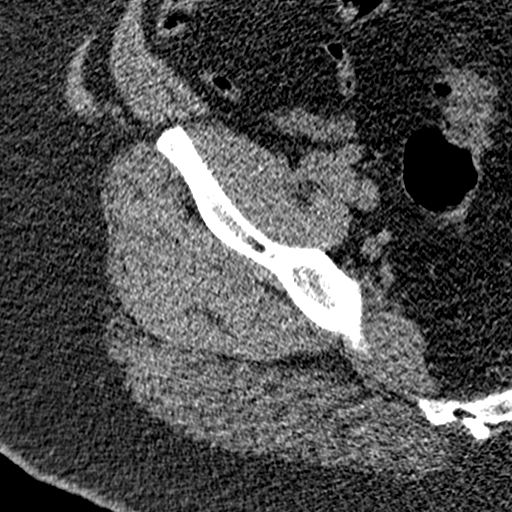

[Series 8: coronal st · coronal · 0.39mm/px · 3 of 91 slices shown]
[im 31/91  soft-tissue]
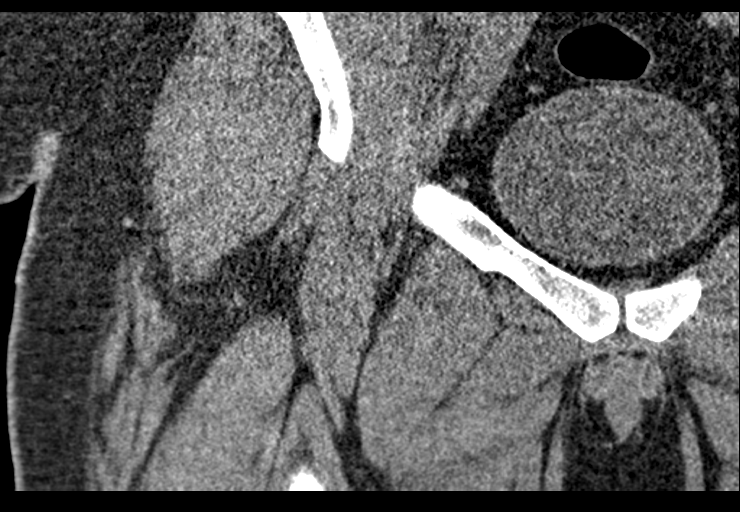
[im 41/91  soft-tissue]
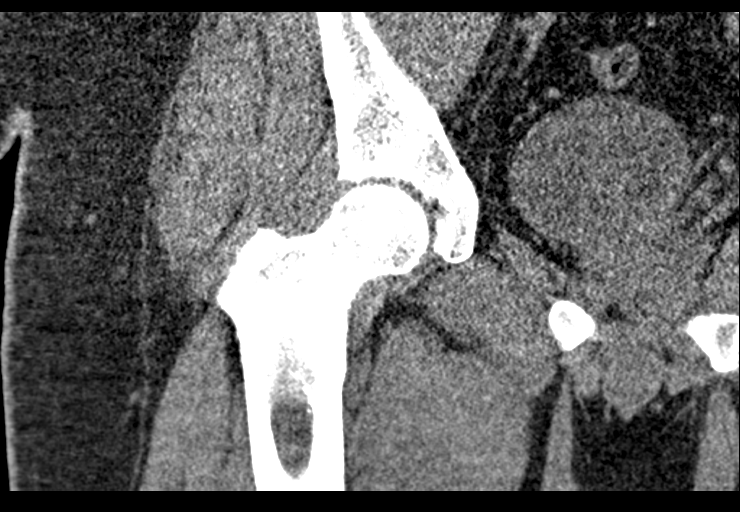
[im 51/91  soft-tissue]
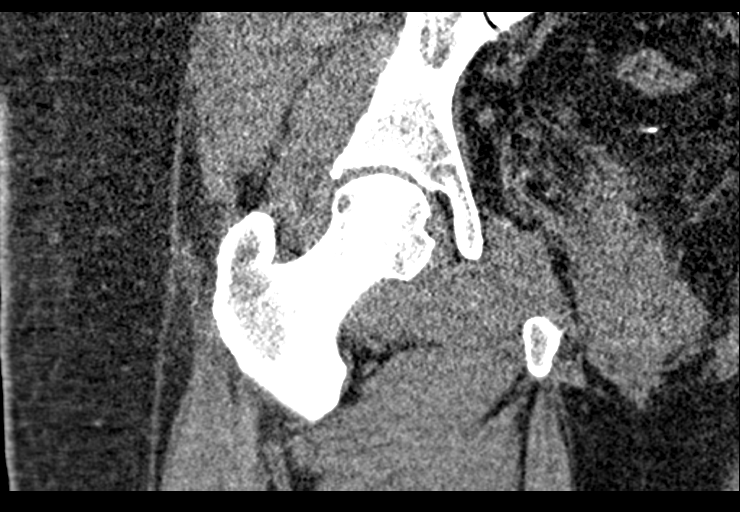

[16 of 46 positions shown; findings below may reference images not displayed]

FINDINGS: Bones/Joint/Cartilage

No fracture of the right hip for hemipelvis. Femoral head is well
seated. Superolateral acetabular osteophytes. Small acetabular bone
island. No evidence of a vascular necrosis. The pubic rami are
intact. No joint effusion. Mild vacuum phenomena in the sacroiliac
joint.

Ligaments

Suboptimally assessed by CT.

Muscles and Tendons

No intramuscular hematoma.

Soft tissues

No soft tissue hematoma.  No intrapelvic free fluid.
IMPRESSION: 1. No fracture of the right hip.
2. Minor degenerative acetabular spurring.
